# Patient Record
Sex: Male | Born: 1970 | Race: White | Hispanic: No | Marital: Single | Smoking: Current some day smoker
Health system: Southern US, Community
[De-identification: ages and names within clinical notes are randomized; demographics above are authoritative.]

## PROBLEM LIST (undated history)

## (undated) DIAGNOSIS — I2510 Atherosclerotic heart disease of native coronary artery without angina pectoris: Secondary | ICD-10-CM

## (undated) HISTORY — PX: PACEMAKER IMPLANT: EP1218

---

## 2019-03-03 ENCOUNTER — Emergency Department (HOSPITAL_BASED_OUTPATIENT_CLINIC_OR_DEPARTMENT_OTHER): Payer: Self-pay

## 2019-03-03 ENCOUNTER — Encounter (HOSPITAL_BASED_OUTPATIENT_CLINIC_OR_DEPARTMENT_OTHER): Payer: Self-pay | Admitting: Emergency Medicine

## 2019-03-03 ENCOUNTER — Other Ambulatory Visit: Payer: Self-pay

## 2019-03-03 ENCOUNTER — Emergency Department (HOSPITAL_BASED_OUTPATIENT_CLINIC_OR_DEPARTMENT_OTHER)
Admission: EM | Admit: 2019-03-03 | Discharge: 2019-03-03 | Disposition: A | Discharge status: Home / Self Care | Payer: Self-pay | Attending: Emergency Medicine | Admitting: Emergency Medicine

## 2019-03-03 DIAGNOSIS — Y93H3 Activity, building and construction: Secondary | ICD-10-CM | POA: Insufficient documentation

## 2019-03-03 DIAGNOSIS — Y999 Unspecified external cause status: Secondary | ICD-10-CM | POA: Insufficient documentation

## 2019-03-03 DIAGNOSIS — I259 Chronic ischemic heart disease, unspecified: Secondary | ICD-10-CM | POA: Insufficient documentation

## 2019-03-03 DIAGNOSIS — Z950 Presence of cardiac pacemaker: Secondary | ICD-10-CM | POA: Insufficient documentation

## 2019-03-03 DIAGNOSIS — Y9289 Other specified places as the place of occurrence of the external cause: Secondary | ICD-10-CM | POA: Insufficient documentation

## 2019-03-03 DIAGNOSIS — F17210 Nicotine dependence, cigarettes, uncomplicated: Secondary | ICD-10-CM | POA: Insufficient documentation

## 2019-03-03 DIAGNOSIS — T189XXA Foreign body of alimentary tract, part unspecified, initial encounter: Secondary | ICD-10-CM

## 2019-03-03 DIAGNOSIS — X58XXXA Exposure to other specified factors, initial encounter: Secondary | ICD-10-CM | POA: Insufficient documentation

## 2019-03-03 HISTORY — DX: Atherosclerotic heart disease of native coronary artery without angina pectoris: I25.10

## 2019-03-03 NOTE — ED Notes (Addendum)
PT instructed to keep  NPO status

## 2019-03-03 NOTE — ED Provider Notes (Signed)
[facility] EMERGENCY DEPARTMENT Provider Note   CSN: [id number] Arrival date & time: 03/03/19  1534     History   Chief Complaint Chief Complaint  Patient presents with  . Sore Throat    swallowed rivet    HPI Jerry Mueller is a 48 y.o. male.     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  Patient with a little bit complaint of throat soreness on the left side.  No trouble breathing.  Now feels like it is in the left upper quadrant area of the abdomen.  Patient denies being on any blood thinners.  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  Patient has a history of coronary artery disease.     Past Medical History:  Diagnosis Date  . Coronary artery disease     There are no active problems to display for this patient.   Past Surgical History:  Procedure Laterality Date  . PACEMAKER IMPLANT          Home Medications    Prior to Admission medications   Not on File    Family History No family history on file.  Social History Social History   Tobacco Use  . Smoking status: Current Some Day Smoker    Packs/day: 1.00  . Smokeless tobacco: Current User  Substance Use Topics  . Alcohol use: Yes    Comment: occasional  . Drug use: Never     Allergies   Patient has no allergy information on record.   Review of Systems Review of Systems  Constitutional: Negative for chills and fever.  HENT: Positive for sore throat. Negative for congestion and rhinorrhea.   Eyes: Negative for visual disturbance.  Respiratory: Negative for cough and shortness of breath.   Cardiovascular: Negative for chest pain and leg swelling.  Gastrointestinal: Negative for abdominal pain, diarrhea, nausea and vomiting.  Genitourinary: Negative for dysuria.  Musculoskeletal: Negative for back pain and neck pain.  Skin: Negative for rash.  Neurological: Negative  for dizziness, light-headedness and headaches.  Hematological: Does not bruise/bleed easily.  Psychiatric/Behavioral: Negative for confusion.     Physical Exam Updated Vital Signs BP 132/78 (BP Location: Right Arm)   Pulse 62   Temp 97.8 F (36.6 C)   Resp 16   Ht 1.778 m (5\' 10" )   Wt 74.8 kg   SpO2 100%   BMI 23.68 kg/m   Physical Exam Vitals signs and nursing note reviewed.  Constitutional:      Appearance: Normal appearance. He is well-developed.  HENT:     Head: Normocephalic and atraumatic.     Mouth/Throat:     Mouth: Mucous membranes are moist.     Pharynx: Oropharynx is clear.  Eyes:     Extraocular Movements: Extraocular movements intact.     Conjunctiva/sclera: Conjunctivae normal.     Pupils: Pupils are equal, round, and reactive to light.  Neck:     Musculoskeletal: Neck supple.  Cardiovascular:     Rate and Rhythm: Normal rate and regular rhythm.     Heart sounds: No murmur.  Pulmonary:     Effort: Pulmonary effort is normal. No respiratory distress.     Breath sounds: Normal breath sounds.     Comments: Patient with bilateral pacemakers left and right anterior chest. Abdominal:     Palpations: Abdomen is soft.     Tenderness: There is no abdominal tenderness.  Skin:    General: Skin is warm and dry.  Neurological:     General: No focal deficit present.     Mental Status: He is alert and oriented to person, place, and time.      ED Treatments / Results  Labs (all labs ordered are listed, but only abnormal results are displayed) Labs Reviewed - No data to display  EKG None  Radiology Dg Chest 2 View  Result Date: 03/03/2019 CLINICAL DATA:  Reported ingestion of a "ceiling rivet" 1 hour prior EXAM: CHEST - 2 VIEW COMPARISON:  None. FINDINGS: Bilateral dual lead pacer devices with 2 leads each directed towards the right atrium and cardiac apex. Rounded mineralization in the left mid lung, likely calcified granuloma. Tracheal air column is  normal. The lungs are clear. No pneumothorax. No effusion. Cardiomediastinal contours are unremarkable. Mild degenerative changes in the shoulders and spine. No acute osseous or soft tissue abnormality. No visualized radiopaque foreign body seen in the chest or upper abdomen. IMPRESSION: No acute cardiopulmonary disease. No visualized radiopaque foreign body in the chest or abdomen. Electronically Signed   By: Lovena Le M.D.   On: 03/03/2019 16:36   Dg Abdomen 1 View  Result Date: 03/03/2019 CLINICAL DATA:  Ingested foreign body, " ceiling rivet " 1 hour prior EXAM: ABDOMEN - 1 VIEW COMPARISON:  None. FINDINGS: Linear metallic foreign body projects over the left upper quadrant. Appears below the gastric bubble, suspect within the small bowel. The bowel gas pattern is nonobstructive. No radio-opaque calculi or other significant radiographic abnormality are seen. Degenerative changes in the spine. Phleboliths in the pelvis. No other soft tissue abnormality. IMPRESSION: Linear metallic foreign body projects over the left upper quadrant, likely within the small bowel. Electronically Signed   By: Lovena Le M.D.   On: 03/03/2019 16:38    Procedures Procedures (including critical care time)  Medications Ordered in ED Medications - No data to display   Initial Impression / Assessment and Plan / ED Course  I have reviewed the triage vital signs and the nursing notes.  Pertinent labs & imaging results that were available during my care of the patient were reviewed by me and considered in my medical decision making (see chart for details).        X-rays of the chest showed no evidence of foreign body.  KUB of the abdomen showed the foreign body to be in the left upper quadrant area.  Radiology feels that it is already passed out of the stomach and is into the small intestine.  Patient should be able to pass this.  Precautions provided.  Patient did not need a work note.  Final Clinical  Impressions(s) / ED Diagnoses   Final diagnoses:  Swallowed foreign body, initial encounter    ED Discharge Orders    None       Fredia Sorrow, MD 03/03/19 1739

## 2019-03-03 NOTE — ED Triage Notes (Signed)
PT swallowed metal rivet 30 minutes ago by accident at work.  approx 1 inch long blunt tipped. PT brought one with them. C/O soreness throat. Airway intact.

## 2019-03-03 NOTE — Discharge Instructions (Addendum)
Return for any abdominal pain any bleeding in your bowel movements or any fevers.  The rib it appears to be outside of the stomach and into your small intestines.  Should pass the rest of the way without difficulty.

## 2020-06-21 IMAGING — CR CHEST - 2 VIEW
2 series · 2 of 2 positions shown · non-contrast
Comparison: None.

CLINICAL DATA: Reported ingestion of a "ceiling Epelbaum" 1 hour prior

EXAM:
CHEST - 2 VIEW

[w chest pa]
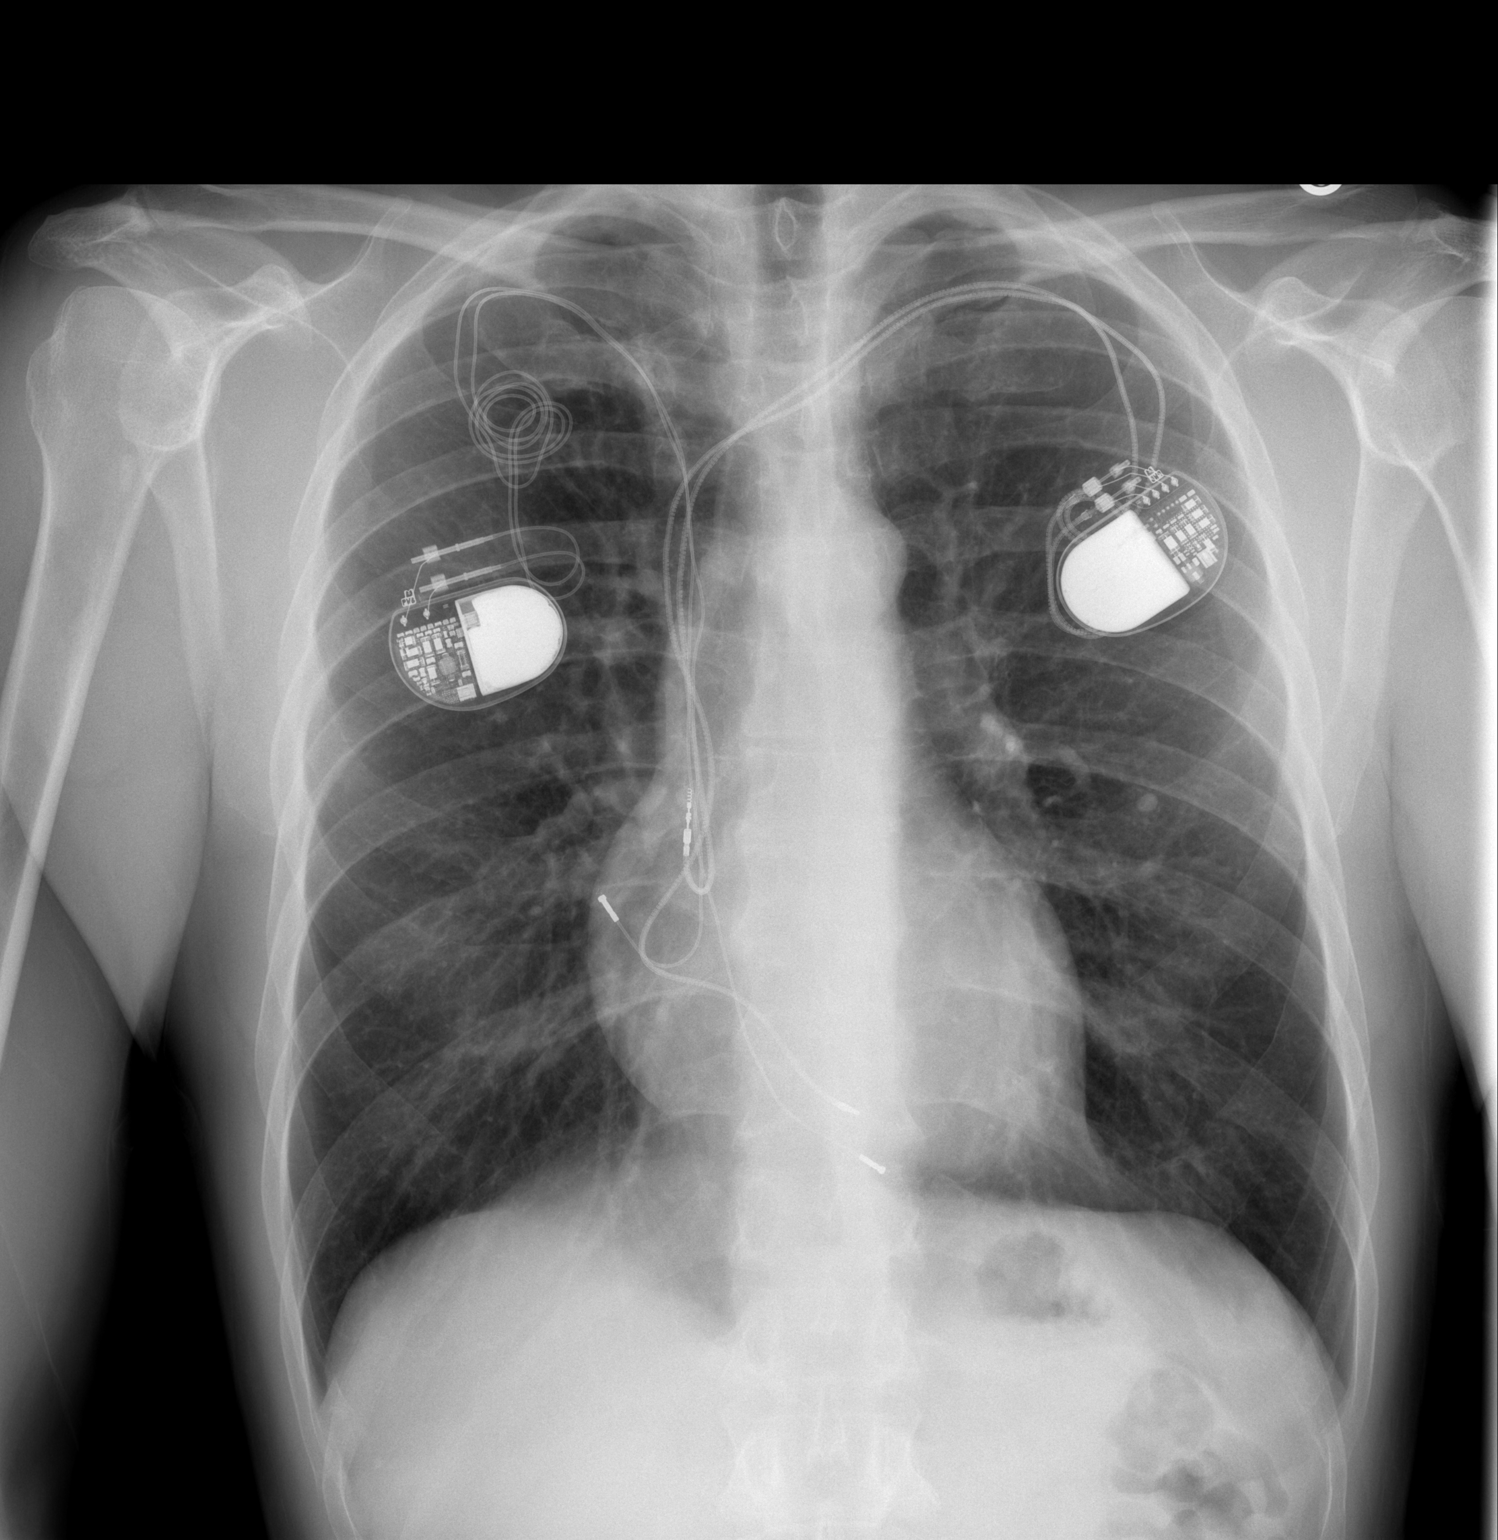

[w chest lat]
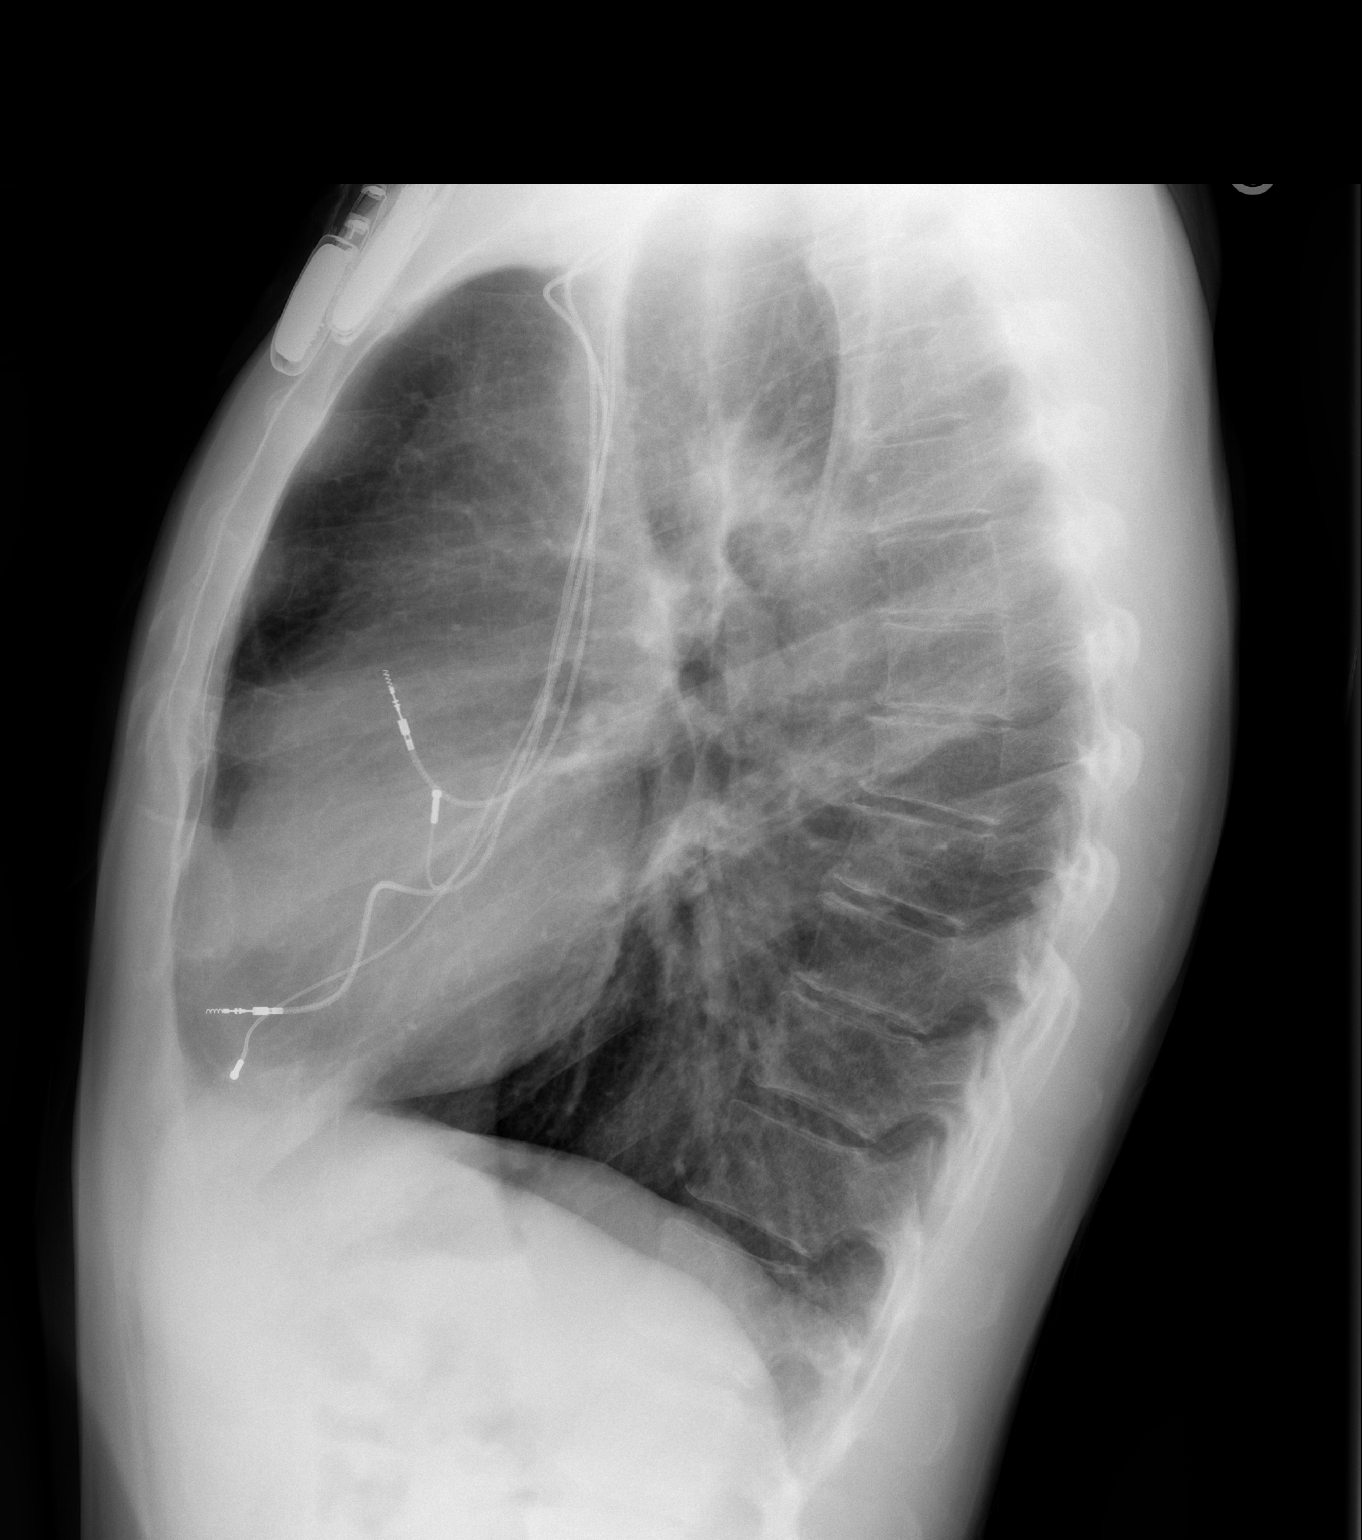

[2 of 2 positions shown; findings below may reference images not displayed]

FINDINGS: Bilateral dual lead pacer devices with 2 leads each directed towards
the right atrium and cardiac apex.

Rounded mineralization in the left mid lung, likely calcified
granuloma. Tracheal air column is normal. The lungs are clear. No
pneumothorax. No effusion. Cardiomediastinal contours are
unremarkable. Mild degenerative changes in the shoulders and spine.
No acute osseous or soft tissue abnormality. No visualized
radiopaque foreign body seen in the chest or upper abdomen.
IMPRESSION: No acute cardiopulmonary disease.

No visualized radiopaque foreign body in the chest or abdomen.
